# Patient Record
Sex: Female | Born: 1995 | Race: Black or African American | Hispanic: No | Marital: Single | Smoking: Never smoker
Health system: Southern US, Community
[De-identification: ages and names within clinical notes are randomized; demographics above are authoritative.]

## PROBLEM LIST (undated history)

## (undated) HISTORY — PX: WISDOM TOOTH EXTRACTION: SHX21

---

## 2018-12-16 ENCOUNTER — Other Ambulatory Visit: Payer: Self-pay

## 2018-12-16 DIAGNOSIS — Z20822 Contact with and (suspected) exposure to covid-19: Secondary | ICD-10-CM

## 2018-12-17 LAB — NOVEL CORONAVIRUS, NAA: SARS-CoV-2, NAA: NOT DETECTED

## 2019-12-10 ENCOUNTER — Emergency Department (HOSPITAL_BASED_OUTPATIENT_CLINIC_OR_DEPARTMENT_OTHER): Admission: EM | Admit: 2019-12-10 | Discharge: 2019-12-10 | Discharge status: Absent without leave | Payer: Self-pay

## 2020-01-29 ENCOUNTER — Inpatient Hospital Stay (HOSPITAL_COMMUNITY): Payer: Self-pay

## 2020-01-29 ENCOUNTER — Encounter (HOSPITAL_BASED_OUTPATIENT_CLINIC_OR_DEPARTMENT_OTHER): Payer: Self-pay | Admitting: *Deleted

## 2020-01-29 ENCOUNTER — Other Ambulatory Visit: Payer: Self-pay

## 2020-01-29 ENCOUNTER — Inpatient Hospital Stay (HOSPITAL_BASED_OUTPATIENT_CLINIC_OR_DEPARTMENT_OTHER)
Admission: AD | Admit: 2020-01-29 | Discharge: 2020-01-29 | Disposition: A | Discharge status: Home / Self Care | Payer: Self-pay | Attending: Obstetrics and Gynecology | Admitting: Obstetrics and Gynecology

## 2020-01-29 DIAGNOSIS — O209 Hemorrhage in early pregnancy, unspecified: Secondary | ICD-10-CM

## 2020-01-29 DIAGNOSIS — O99322 Drug use complicating pregnancy, second trimester: Secondary | ICD-10-CM | POA: Insufficient documentation

## 2020-01-29 DIAGNOSIS — O4690 Antepartum hemorrhage, unspecified, unspecified trimester: Secondary | ICD-10-CM

## 2020-01-29 DIAGNOSIS — O034 Incomplete spontaneous abortion without complication: Secondary | ICD-10-CM | POA: Insufficient documentation

## 2020-01-29 DIAGNOSIS — O26891 Other specified pregnancy related conditions, first trimester: Secondary | ICD-10-CM

## 2020-01-29 LAB — URINALYSIS, ROUTINE W REFLEX MICROSCOPIC
Glucose, UA: NEGATIVE mg/dL
Ketones, ur: 40 mg/dL — AB
Leukocytes,Ua: NEGATIVE
Nitrite: NEGATIVE
Protein, ur: 30 mg/dL — AB
Specific Gravity, Urine: >1.03 — ABNORMAL HIGH (ref 1.005–1.030)
pH: 6 (ref 5.0–8.0)

## 2020-01-29 LAB — URINALYSIS, MICROSCOPIC (REFLEX): RBC / HPF: >50 RBC/hpf (ref 0–5)

## 2020-01-29 LAB — COMPREHENSIVE METABOLIC PANEL
ALT: 9 U/L (ref 0–44)
AST: 13 U/L — ABNORMAL LOW (ref 15–41)
Albumin: 3.6 g/dL (ref 3.5–5.0)
Alkaline Phosphatase: 44 U/L (ref 38–126)
Anion gap: 11 (ref 5–15)
BUN: 7 mg/dL (ref 6–20)
CO2: 22 mmol/L (ref 22–32)
Calcium: 8.8 mg/dL — ABNORMAL LOW (ref 8.9–10.3)
Chloride: 101 mmol/L (ref 98–111)
Creatinine, Ser: 0.5 mg/dL (ref 0.44–1.00)
GFR calc Af Amer: >60 mL/min (ref >60)
GFR calc non Af Amer: >60 mL/min (ref >60)
Glucose, Bld: 84 mg/dL (ref 70–99)
Potassium: 3.2 mmol/L — ABNORMAL LOW (ref 3.5–5.1)
Sodium: 134 mmol/L — ABNORMAL LOW (ref 135–145)
Total Bilirubin: 0.4 mg/dL (ref 0.3–1.2)
Total Protein: 7.5 g/dL (ref 6.5–8.1)

## 2020-01-29 LAB — CBC WITH DIFFERENTIAL/PLATELET
Abs Immature Granulocytes: 0.04 10*3/uL (ref 0.00–0.07)
Basophils Absolute: 0 10*3/uL (ref 0.0–0.1)
Basophils Relative: 0 %
Eosinophils Absolute: 0.1 10*3/uL (ref 0.0–0.5)
Eosinophils Relative: 1 %
HCT: 25.9 % — ABNORMAL LOW (ref 36.0–46.0)
Hemoglobin: 7.6 g/dL — ABNORMAL LOW (ref 12.0–15.0)
Immature Granulocytes: 0 %
Lymphocytes Relative: 17 %
Lymphs Abs: 1.7 10*3/uL (ref 0.7–4.0)
MCH: 22.4 pg — ABNORMAL LOW (ref 26.0–34.0)
MCHC: 29.3 g/dL — ABNORMAL LOW (ref 30.0–36.0)
MCV: 76.4 fL — ABNORMAL LOW (ref 80.0–100.0)
Monocytes Absolute: 0.5 10*3/uL (ref 0.1–1.0)
Monocytes Relative: 5 %
Neutro Abs: 7.4 10*3/uL (ref 1.7–7.7)
Neutrophils Relative %: 77 %
Platelets: 438 10*3/uL — ABNORMAL HIGH (ref 150–400)
RBC: 3.39 MIL/uL — ABNORMAL LOW (ref 3.87–5.11)
RDW: 19.7 % — ABNORMAL HIGH (ref 11.5–15.5)
WBC: 9.7 10*3/uL (ref 4.0–10.5)
nRBC: 0 % (ref 0.0–0.2)

## 2020-01-29 LAB — HCG, QUANTITATIVE, PREGNANCY: hCG, Beta Chain, Quant, S: 184 m[IU]/mL — ABNORMAL HIGH (ref <5)

## 2020-01-29 LAB — ABO/RH: ABO/RH(D): O POS

## 2020-01-29 LAB — PREGNANCY, URINE: Preg Test, Ur: POSITIVE — AB

## 2020-01-29 MED ORDER — DOXYCYCLINE HYCLATE 100 MG PO CAPS: 100.0000 mg | ORAL_CAPSULE | Freq: Two times a day (BID) | ORAL | 0 refills | Status: AC

## 2020-01-29 MED ORDER — MISOPROSTOL 200 MCG PO TABS: 800.0000 ug | ORAL_TABLET | Freq: Once | ORAL | 0 refills | Status: AC

## 2020-01-29 NOTE — ED Triage Notes (Signed)
Right lower quad pain since last night. LMP started 2 days ago.

## 2020-01-29 NOTE — ED Provider Notes (Signed)
[facility] EMERGENCY DEPARTMENT Provider Note   CSN: [id number] Arrival date & time: 01/29/20  1209     History Chief Complaint  Patient presents with  . Abdominal Pain    Alexandra Perkins is a 24 y.o. female.  Alexandra Perkins is a 24 y.o. female who is otherwise healthy, presents to the ED for vaginal bleeding and intermittent right lower quadrant pain.  Patient states that she had an abortion on September 17 which was done with medication, she did not have any procedure performed after that she had vaginal bleeding and some right-sided abdominal pain for about 2 days, and then states her symptoms resolved.  2 weeks passed and then she anticipated getting her typical menstrual cycle, started having bleeding yesterday but right lower quadrant pain returned and has been persistent since last night, keeping her from sleeping.  She started having vaginal bleeding yesterday which she reports is moderate and typical for her menstrual cycle, she has to change her pad every 4 hours.  She has not taken another pregnancy test since her abortion, and reports she has not been sexually active since her abortion.  She denies any associated lightheadedness, no vaginal discharge or dysuria.  She has a known history of low hemoglobin and iron deficiency anemia.        History reviewed. No pertinent past medical history.  There are no problems to display for this patient.   History reviewed. No pertinent surgical history.   OB History   No obstetric history on file.     No family history on file.  Social History   Tobacco Use  . Smoking status: Never Smoker  . Smokeless tobacco: Never Used  Substance Use Topics  . Alcohol use: Yes  . Drug use: Yes    Types: Marijuana    Home Medications Prior to Admission medications   Medication Sig Start Date End Date Taking? Authorizing Provider  Iron-FA-B Cmp-C-Biot-Probiotic (FUSION PLUS) CAPS Take 1 capsule by mouth daily. 08/11/16   Yes [provider]    Allergies    Patient has no known allergies.  Review of Systems   Review of Systems  Constitutional: Negative for chills and fever.  Respiratory: Negative for cough and shortness of breath.   Cardiovascular: Negative for chest pain.  Gastrointestinal: Positive for abdominal pain. Negative for nausea and vomiting.  Genitourinary: Positive for pelvic pain and vaginal bleeding. Negative for dysuria and vaginal discharge.  Neurological: Negative for syncope and light-headedness.  All other systems reviewed and are negative.   Physical Exam Updated Vital Signs BP 117/72   Pulse 73   Temp 98.5 F (36.9 C) (Oral)   Resp 20   Ht 5\' 5"  (1.651 m)   Wt 66.2 kg   LMP 01/27/2020   SpO2 100%   BMI 24.30 kg/m   Physical Exam Vitals and nursing note reviewed.  Constitutional:      General: She is not in acute distress.    Appearance: She is well-developed. She is not ill-appearing or diaphoretic.     Comments: Well-appearing and in no distress  HENT:     Head: Normocephalic and atraumatic.  Eyes:     General:        Right eye: No discharge.        Left eye: No discharge.     Pupils: Pupils are equal, round, and reactive to light.  Cardiovascular:     Rate and Rhythm: Normal rate and regular rhythm.     Heart  sounds: Normal heart sounds.  Pulmonary:     Effort: Pulmonary effort is normal. No respiratory distress.     Breath sounds: Normal breath sounds. No wheezing or rales.     Comments: Respirations equal and unlabored, patient able to speak in full sentences, lungs clear to auscultation bilaterally Abdominal:     General: Bowel sounds are normal. There is no distension.     Palpations: Abdomen is soft. There is no mass.     Tenderness: There is abdominal tenderness in the right lower quadrant. There is no guarding.     Comments: Abdomen is soft, nondistended, bowel sounds present throughout, there is some tenderness in the right lower quadrant  without guarding or rebound tenderness, all other quadrants nontender to palpation.  Genitourinary:    Comments: Defer pelvic exam to OB/GYN Musculoskeletal:        General: No deformity.     Cervical back: Neck supple.  Skin:    General: Skin is warm and dry.     Capillary Refill: Capillary refill takes less than 2 seconds.  Neurological:     Mental Status: She is alert.     Coordination: Coordination normal.     Comments: Speech is clear, able to follow commands Moves extremities without ataxia, coordination intact  Psychiatric:        Mood and Affect: Mood normal.        Behavior: Behavior normal.     ED Results / Procedures / Treatments   Labs (all labs ordered are listed, but only abnormal results are displayed) Labs Reviewed  URINALYSIS, ROUTINE W REFLEX MICROSCOPIC - Abnormal; Notable for the following components:      Result Value   Specific Gravity, Urine >1.030 (*)    Hgb urine dipstick LARGE (*)    Bilirubin Urine SMALL (*)    Ketones, ur 40 (*)    Protein, ur 30 (*)    All other components within normal limits  CBC WITH DIFFERENTIAL/PLATELET - Abnormal; Notable for the following components:   RBC 3.39 (*)    Hemoglobin 7.6 (*)    HCT 25.9 (*)    MCV 76.4 (*)    MCH 22.4 (*)    MCHC 29.3 (*)    RDW 19.7 (*)    Platelets 438 (*)    All other components within normal limits  COMPREHENSIVE METABOLIC PANEL - Abnormal; Notable for the following components:   Sodium 134 (*)    Potassium 3.2 (*)    Calcium 8.8 (*)    AST 13 (*)    All other components within normal limits  PREGNANCY, URINE - Abnormal; Notable for the following components:   Preg Test, Ur POSITIVE (*)    All other components within normal limits  URINALYSIS, MICROSCOPIC (REFLEX) - Abnormal; Notable for the following components:   Bacteria, UA MANY (*)    Trichomonas, UA PRESENT (*)    All other components within normal limits  HCG, QUANTITATIVE, PREGNANCY  ABO/RH     EKG None  Radiology No results found.  Procedures Procedures (including critical care time)  Medications Ordered in ED Medications - No data to display  ED Course  I have reviewed the triage vital signs and the nursing notes.  Pertinent labs & imaging results that were available during my care of the patient were reviewed by me and considered in my medical decision making (see chart for details).    MDM Rules/Calculators/A&P  24 year old female presents to the emergency department for evaluation of vaginal bleeding and right lower quadrant abdominal pain.  She had an abortion on 9/17 done with medication, did not have any procedure, had 2 days of bleeding and pain after this and then symptoms resolved and she thought she was starting back her normal menstrual cycle, her pregnancy test here today is positive and she had return of right lower quadrant pain last night that has been severe and constant.  She has not had any fevers or vaginal discharge.  States bleeding is like her typical menstrual cycle.  Patient found to have a hemoglobin of 7.6 but she has a history of low hemoglobin, previously documented at 8.1.  Is not having any lightheadedness or syncope.  Potassium of 3.2 but no other significant electrolyte derangements, normal renal and liver function.  Urinalysis with many bacteria present, also some squamous cells, trichomonas present as well.  Feel patient will need ultrasound to rule out ectopic pregnancy versus incomplete abortion or retained products.  Ultrasound is not available at this facility tonight.  Case discussed with Dr. Nettie Elm with OB/GYN, we do not have ultrasound available at [facility], he agrees that the patient will need to be evaluated in the MAU for ultrasound. She is currently hemodynamically stable. Will get quantitative hCG and Rh while patient is waiting for transport.  Initially plan for [facility]  transport patient is hemodynamically stable, without any near syncopal symptoms, and does not have any way to get back to her car if she was transported by [facility], myself and Dr. Troponin feel patient is stable for transport via private vehicle to MAU.  I have given patient strict instructions not to make any stops on the way and proceed directly to [facility] to the MAU.  She expresses understanding and agreement.  Nursing staff has called MAU and given report prior to patient's transfer.  Final Clinical Impression(s) / ED Diagnoses Final diagnoses:  Vaginal bleeding in pregnancy    Rx / DC Orders ED Discharge Orders    None       Dartha Lodge, [place] 01/29/20 1947    Terald Sleeper, MD 01/30/20 [phone]

## 2020-01-29 NOTE — ED Notes (Signed)
Pt complaining of vaginal bleeding with RLQ intermittent pain starting last night. States she had an abortion September 17th with a pill. States had similar pain after abortion however it went away. Pt denies intercourse after abortion. States bleeding is like her typical menstrual cycle, not heavy/light. Hx iron deficiency and low Hgb.  Pt informed of by this RN and PA, Adelina Mings, of importance of ultrasound and that there is no [place] tech available tonight. Agreeable to transfer to MAU at [facility], however worried about getting back to car at [facility] as she does not have someone to drive her back if discharged.

## 2020-01-29 NOTE — ED Notes (Signed)
Pt d/c POV to MAU unit. Report called to MAU prior to pt leaving. Denies questions. Ambulatory.

## 2020-01-29 NOTE — MAU Note (Signed)
. .  Alexandra Perkins is a 24 y.o.  here in MAU reporting: Transfer from [facility], recent abortion on 01/12/20. She states that she took the "pills" on 01/13/20. Had vaginal bleeding for a week and states she started her "other cycle" Saturday. Lower right sided abdominal pain that started last night and is exacerbated with movement.   Pain score: 0 Vitals:   01/29/20 1926 01/29/20 2026  BP: 124/63 129/78  Pulse: 72 77  Resp:  16  Temp:  99 F (37.2 C)  SpO2: 100%

## 2020-01-29 NOTE — Discharge Instructions (Addendum)
Return to care   If you have heavier bleeding that soaks through more that 2 pads per hour for an hour or more  If you bleed so much that you feel like you might pass out or you do pass out  If you have significant abdominal pain that is not improved with Tylenol   Or if you have a fever >100.4

## 2020-01-29 NOTE — MAU Provider Note (Signed)
History     CSN: [id number]  Arrival date and time: 01/29/20 1209   First Provider Initiated Contact with Patient 01/29/20 2044      Chief Complaint  Patient presents with  . Abdominal Pain   Alexandra Perkins is a 24 y.o. G1P0010 at Unknown who presents for vaginal bleeding. She was transferred from [facility] for further evaluation. Patient had medical abortion through a [facility] on 9/17. Reports some bleeding that resolved then started again this weekend. Last night had significant lower abdominal cramping that has since improved without medication. Reports current bleeding is like a period; is not saturating pads or passing blood clots. Denies fever/chills. Does not have a PCP or ob/gyn.   Location: abdomen Quality: cramping Severity: currently 0/10 on pain scale Duration: 1 day Timing: intermittent Modifying factors: none Associated signs and symptoms: vaginal bleeding    OB History    Gravida  1   Para      Term      Preterm      AB  1   Living        SAB      TAB      Ectopic      Multiple      Live Births              History reviewed. No pertinent past medical history.  Past Surgical History:  Procedure Laterality Date  . WISDOM TOOTH EXTRACTION      No family history on file.  Social History   Tobacco Use  . Smoking status: Never Smoker  . Smokeless tobacco: Never Used  Substance Use Topics  . Alcohol use: Yes  . Drug use: Yes    Types: Marijuana    Allergies: No Known Allergies  Medications Prior to Admission  Medication Sig Dispense Refill Last Dose  . Iron-FA-B Cmp-C-Biot-Probiotic (FUSION PLUS) CAPS Take 1 capsule by mouth daily.   Past Week at Unknown time    Review of Systems  Constitutional: Negative.   Gastrointestinal: Positive for abdominal pain (not currently). Negative for constipation, diarrhea, nausea and vomiting.  Genitourinary: Positive for vaginal bleeding. Negative for vaginal discharge.   Neurological: Negative.    Physical Exam   Blood pressure 129/78, pulse 77, temperature 99 F (37.2 C), temperature source Oral, resp. rate 16, height 5\' 5"  (1.651 m), weight 65 kg, last menstrual period 01/27/2020, SpO2 100 %.  Physical Exam Vitals and nursing note reviewed.  Constitutional:      General: She is not in acute distress.    Appearance: She is well-developed and normal weight.  HENT:     Head: Normocephalic and atraumatic.  Cardiovascular:     Rate and Rhythm: Normal rate.     Heart sounds: Normal heart sounds.  Pulmonary:     Effort: Pulmonary effort is normal. No respiratory distress.     Breath sounds: Normal breath sounds.  Abdominal:     General: Abdomen is flat. There is no distension.     Palpations: Abdomen is soft.     Tenderness: There is no abdominal tenderness.  Neurological:     Mental Status: She is alert.     MAU Course  Procedures Results for orders placed or performed during the hospital encounter of 01/29/20 (from the past 24 hour(s))  CBC with Differential     Status: Abnormal   Collection Time: 01/29/20 12:37 PM  Result Value Ref Range   WBC 9.7 4.0 - 10.5 K/uL  RBC 3.39 (L) 3.87 - 5.11 MIL/uL   Hemoglobin 7.6 (L) 12.0 - 15.0 g/dL   HCT 67.1 (L) 36 - 46 %   MCV 76.4 (L) 80.0 - 100.0 fL   MCH 22.4 (L) 26.0 - 34.0 pg   MCHC 29.3 (L) 30.0 - 36.0 g/dL   RDW 24.5 (H) 80.9 - 98.3 %   Platelets 438 (H) 150 - 400 K/uL   nRBC 0.0 0.0 - 0.2 %   Neutrophils Relative % 77 %   Neutro Abs 7.4 1.7 - 7.7 K/uL   Lymphocytes Relative 17 %   Lymphs Abs 1.7 0.7 - 4.0 K/uL   Monocytes Relative 5 %   Monocytes Absolute 0.5 0 - 1 K/uL   Eosinophils Relative 1 %   Eosinophils Absolute 0.1 0 - 0 K/uL   Basophils Relative 0 %   Basophils Absolute 0.0 0 - 0 K/uL   Immature Granulocytes 0 %   Abs Immature Granulocytes 0.04 0.00 - 0.07 K/uL  Comprehensive metabolic panel     Status: Abnormal   Collection Time: 01/29/20 12:37 PM  Result Value Ref  Range   Sodium 134 (L) 135 - 145 mmol/L   Potassium 3.2 (L) 3.5 - 5.1 mmol/L   Chloride 101 98 - 111 mmol/L   CO2 22 22 - 32 mmol/L   Glucose, Bld 84 70 - 99 mg/dL   BUN 7 6 - 20 mg/dL   Creatinine, Ser 3.82 0.44 - 1.00 mg/dL   Calcium 8.8 (L) 8.9 - 10.3 mg/dL   Total Protein 7.5 6.5 - 8.1 g/dL   Albumin 3.6 3.5 - 5.0 g/dL   AST 13 (L) 15 - 41 U/L   ALT 9 0 - 44 U/L   Alkaline Phosphatase 44 38 - 126 U/L   Total Bilirubin 0.4 0.3 - 1.2 mg/dL   GFR calc non Af Amer >60 >60 mL/min   GFR calc Af Amer >60 >60 mL/min   Anion gap 11 5 - 15  Urinalysis, Routine w reflex microscopic Urine, Clean Catch     Status: Abnormal   Collection Time: 01/29/20  3:15 PM  Result Value Ref Range   Color, Urine YELLOW YELLOW   APPearance CLEAR CLEAR   Specific Gravity, Urine >1.030 (H) 1.005 - 1.030   pH 6.0 5.0 - 8.0   Glucose, UA NEGATIVE NEGATIVE mg/dL   Hgb urine dipstick LARGE (A) NEGATIVE   Bilirubin Urine SMALL (A) NEGATIVE   Ketones, ur 40 (A) NEGATIVE mg/dL   Protein, ur 30 (A) NEGATIVE mg/dL   Nitrite NEGATIVE NEGATIVE   Leukocytes,Ua NEGATIVE NEGATIVE  Pregnancy, urine     Status: Abnormal   Collection Time: 01/29/20  3:15 PM  Result Value Ref Range   Preg Test, Ur POSITIVE (A) NEGATIVE  Urinalysis, Microscopic (reflex)     Status: Abnormal   Collection Time: 01/29/20  3:15 PM  Result Value Ref Range   RBC / HPF >50 0 - 5 RBC/hpf   WBC, UA 0-5 0 - 5 WBC/hpf   Bacteria, UA MANY (A) NONE SEEN   Squamous Epithelial / LPF 6-10 0 - 5   Mucus PRESENT    Trichomonas, UA PRESENT (A) NONE SEEN  hCG, quantitative, pregnancy     Status: Abnormal   Collection Time: 01/29/20  6:22 PM  Result Value Ref Range   hCG, Beta Chain, Quant, S 184 (H) <5 mIU/mL  ABO/Rh     Status: None   Collection Time: 01/29/20  6:22 PM  Result  Value Ref Range   ABO/RH(D) O POS    No rh immune globuloin      NOT A RH IMMUNE GLOBULIN CANDIDATE, PT RH POSITIVE Performed at [facility], [address], [place], [place] [address]    US OB LESS THAN 14 WEEKS WITH [place] TRANSVAGINAL  Result Date: 01/29/2020 CLINICAL DATA:  Right lower quadrant pain. Therapeutic abortion 01/12/2020. Beta HCG 184. EXAM: OBSTETRIC <14 WK [place] AND TRANSVAGINAL OB US TECHNIQUE: Both transabdominal and transvaginal ultrasound examinations were performed for complete evaluation of the gestation as well as the maternal uterus, adnexal regions, and pelvic cul-de-sac. Transvaginal technique was performed to assess early pregnancy. COMPARISON:  None. FINDINGS: Intrauterine gestational sac: None Yolk sac:  Not Visualized. Embryo:  Not Visualized. Maternal uterus/adnexae: The uterus is anteverted. The endometrium is heterogeneous measuring 10 mm. There is endometrial vascularity. No evidence of intrauterine gestational sac. The right ovary is normal measuring 3.1 x 2.3 x 2.5 cm with blood flow. The left ovary is normal measuring 4.0 x 2.6 x 3.6 cm with blood flow. There is no adnexal mass. No pelvic free fluid. IMPRESSION: 1. No intrauterine gestational sac. Heterogeneous endometrium measuring 10 mm with internal vascularity. Sonographic findings are compatible with retained products of conception in the correct clinical setting. 2. Normal sonographic appearance of both ovaries.  No adnexal mass. Electronically Signed   By: Narda Rutherford M.D.   On: 01/29/2020 21:23   MDM RH positive - rhogam not indicated  Labs from [facility] reviewed - hemoglobin low at 7.6 with nothing in epic to compare to. Patient reports known history of anemia. Vital signs are stable & patient has no symptoms of acute blood loss.   HCG tonight is 184. Ultrasound shows ET of 10 mm & some vascularity. Reviewed patient with Dr. Alysia Penna. Can offer cytotec vs outpatient D&C. Reviewed hemoglobin with Dr. Alysia Penna, ok for cytotec if patient chooses as she is stable & don't anticipate acute blood loss based on ultrasound. Will also start on abx due to increased temp. White count  normal tonight.   Reviewed results & treatment options with patient. She prefers cytotec to take at home. She lives with her parents who will be with her during treatment. Reviewed bleeding precautions & when to present to closest ED. She would like follow up with our office as she does not have f/u with [facility].   Assessment and Plan   1. Retained products of conception following abortion  rx cytotec. Reviewed bleeding precautions & when to return to MAU. Continue iron supplements she was already taking -will msg office to get f/u -rx doxycycline -pelvic rest  2. Vaginal bleeding in pregnancy   3. Vaginal bleeding in pregnancy, first trimester   4. Abdominal pain during pregnancy in first trimester      Judeth Horn 01/29/2020, 8:44 PM

## 2020-02-13 ENCOUNTER — Ambulatory Visit: Payer: Self-pay | Admitting: Obstetrics and Gynecology

## 2021-06-22 IMAGING — US US OB < 14 WEEKS - US OB TV
1 series · 15 of 28 positions shown · non-contrast
Comparison: None.

CLINICAL DATA: Right lower quadrant pain. Therapeutic abortion
01/12/2020. Beta HCG 184.

EXAM:
OBSTETRIC <14 WK US AND TRANSVAGINAL OB US
TECHNIQUE: Both transabdominal and transvaginal ultrasound examinations were
performed for complete evaluation of the gestation as well as the
maternal uterus, adnexal regions, and pelvic cul-de-sac.
Transvaginal technique was performed to assess early pregnancy.

[Series 1: us ob < 14 weeks - us ob tv · 15 of 38 slices shown]
[im 1/38]
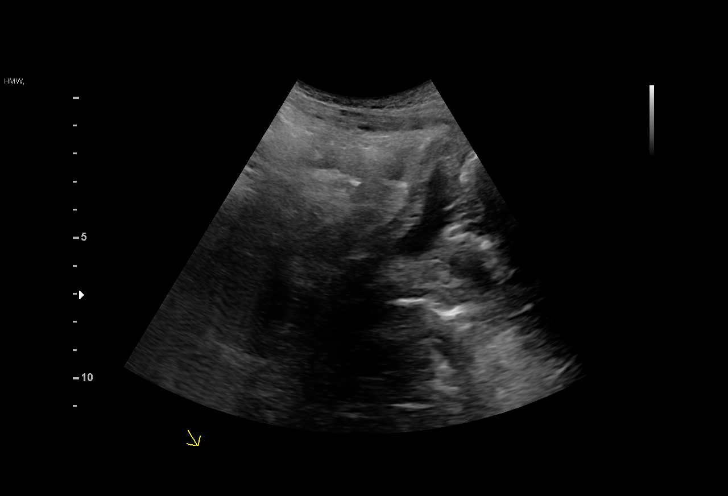
[im 3/38]
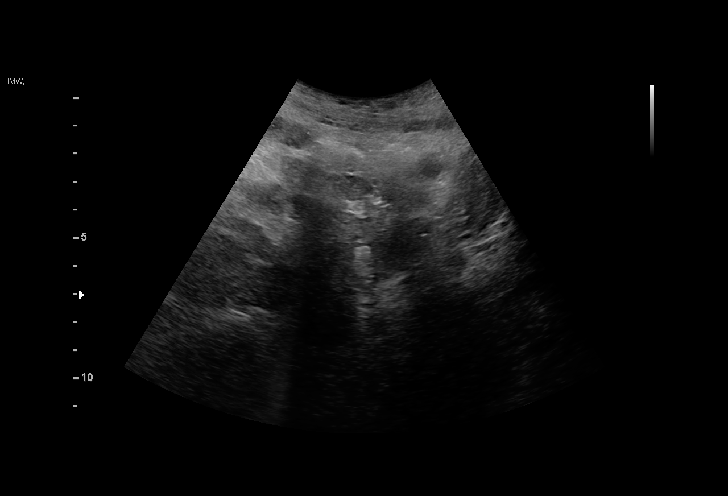
[im 6/38]
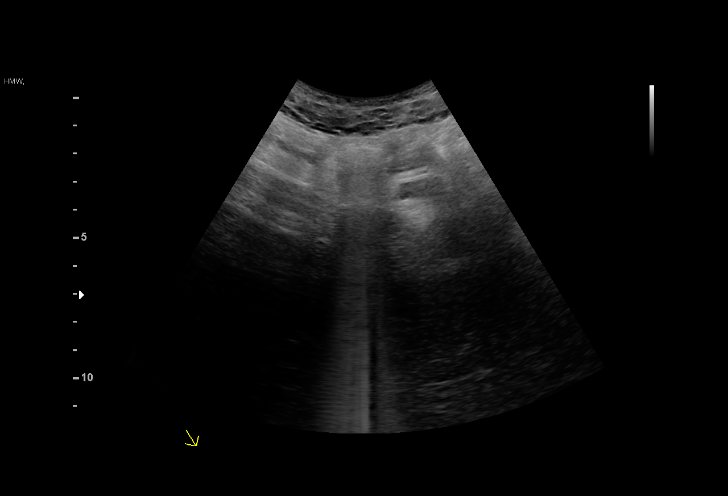
[im 9/38]
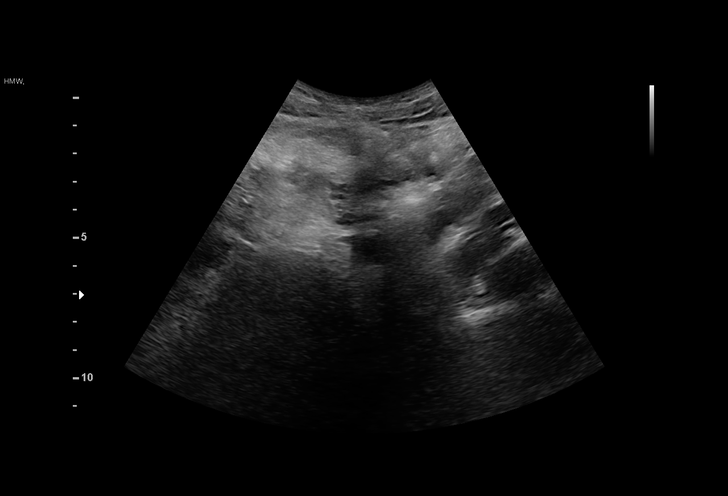
[im 11/38]
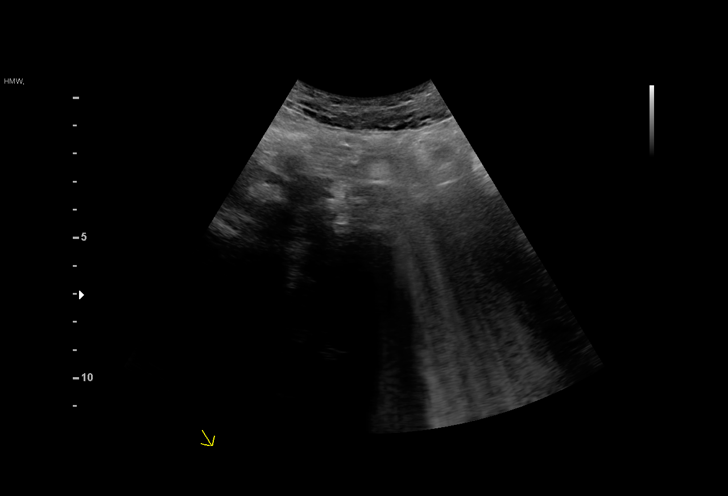
[im 14/38]
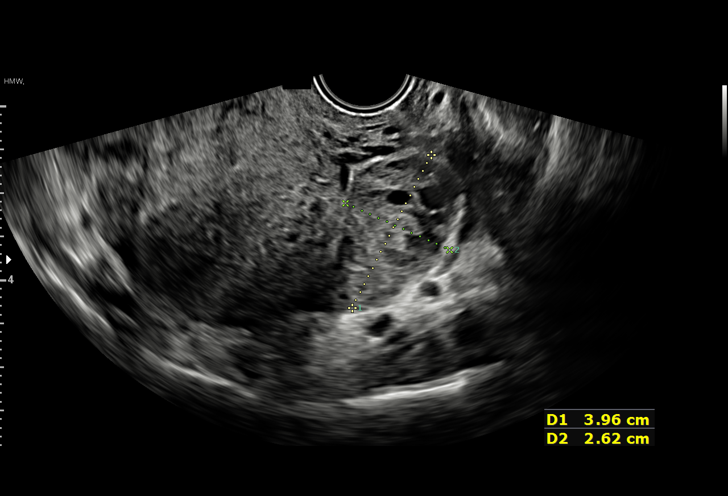
[im 17/38]
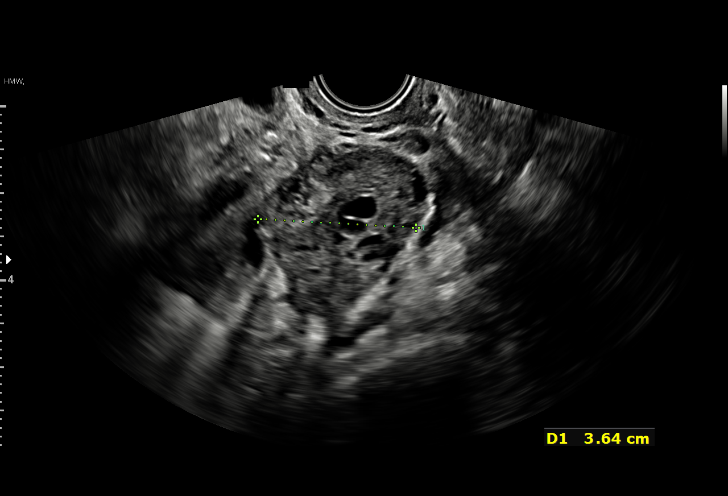
[im 20/38]
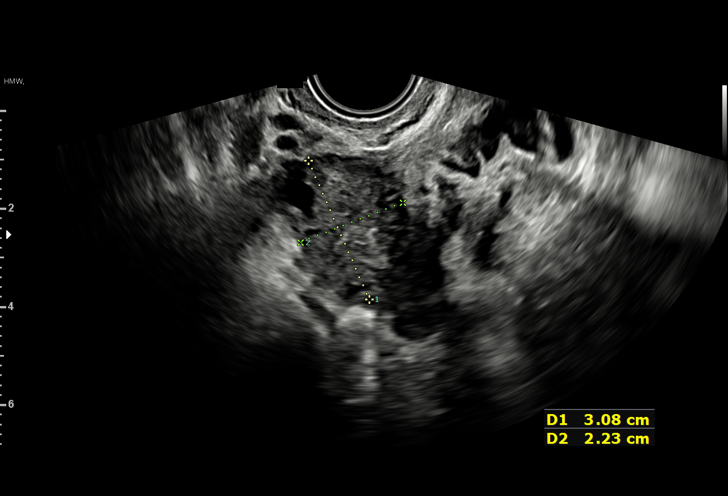
[im 21/38]
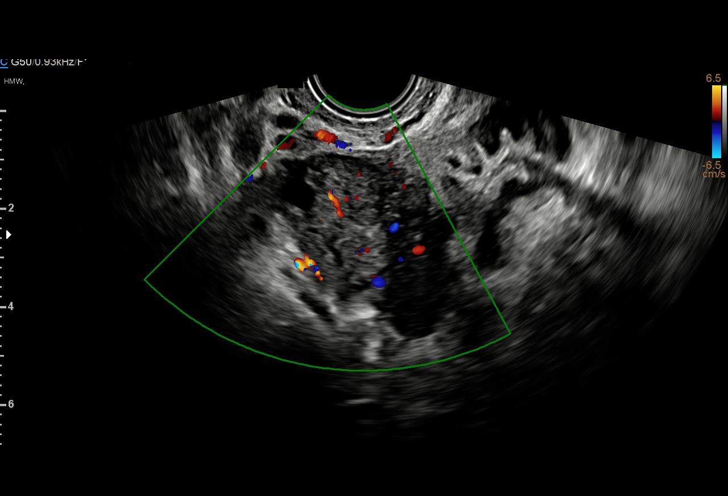
[im 24/38]
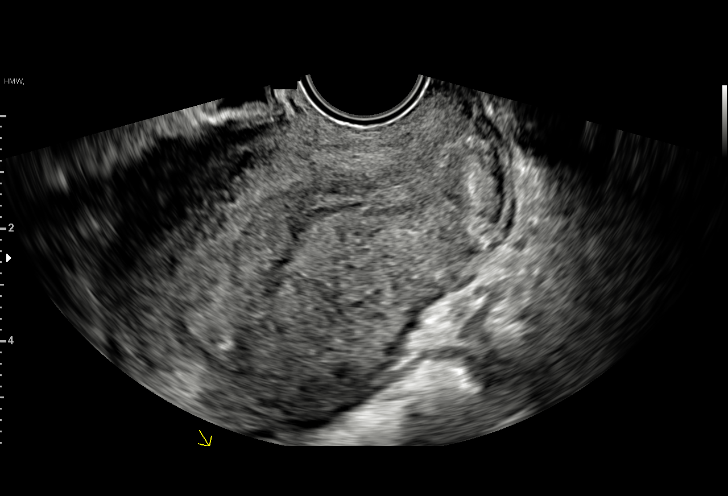
[im 27/38]
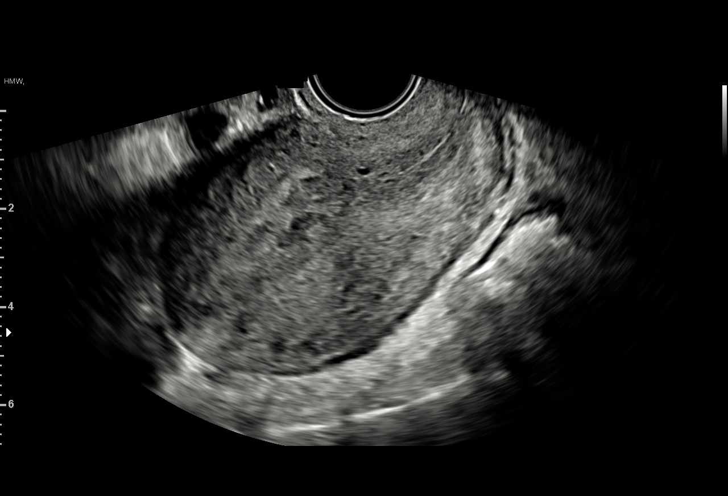
[im 29/38]
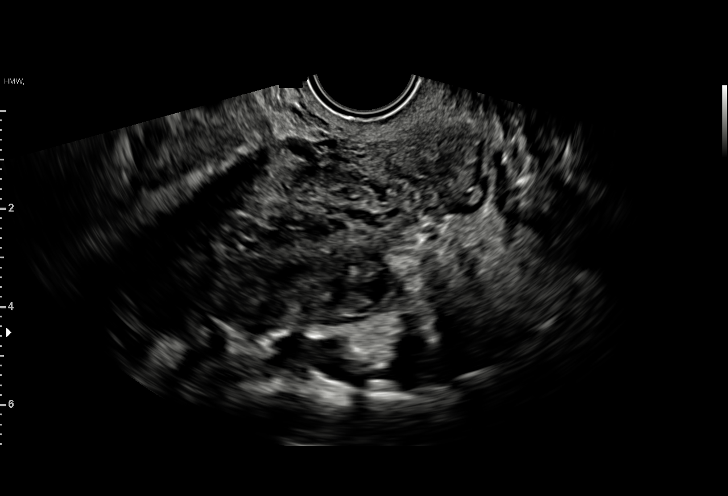
[im 32/38]
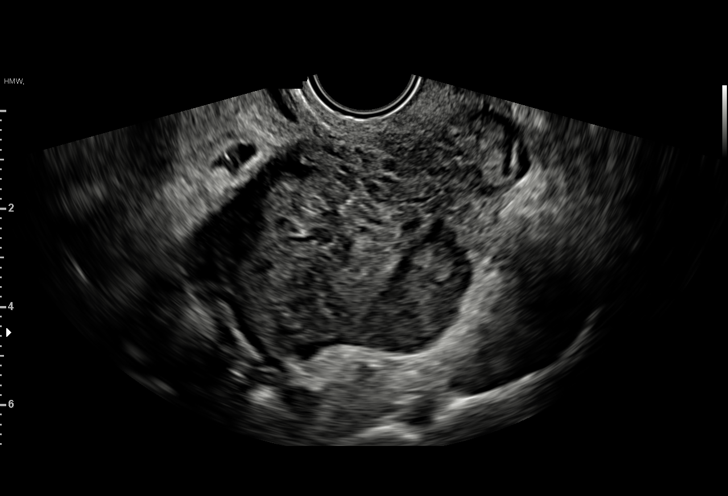
[im 35/38]
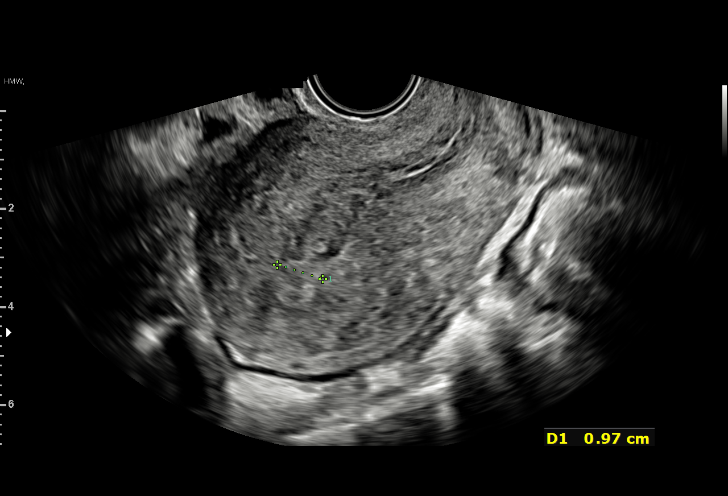
[im 38/38]
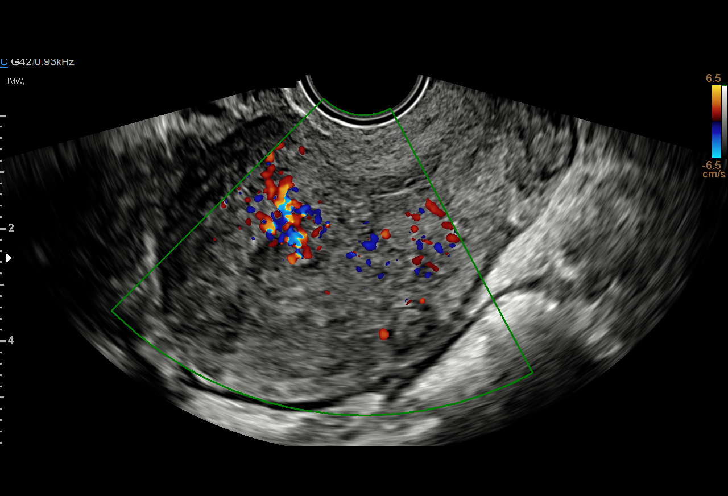

[15 of 28 positions shown; findings below may reference images not displayed]

FINDINGS: Intrauterine gestational sac: None

Yolk sac:  Not Visualized.

Embryo:  Not Visualized.

Maternal uterus/adnexae: The uterus is anteverted. The endometrium
is heterogeneous measuring 10 mm. There is endometrial vascularity.
No evidence of intrauterine gestational sac. The right ovary is
normal measuring 3.1 x 2.3 x 2.5 cm with blood flow. The left ovary
is normal measuring 4.0 x 2.6 x 3.6 cm with blood flow. There is no
adnexal mass. No pelvic free fluid.
IMPRESSION: 1. No intrauterine gestational sac. Heterogeneous endometrium
measuring 10 mm with internal vascularity. Sonographic findings are
compatible with retained products of conception in the correct
clinical setting.
2. Normal sonographic appearance of both ovaries.  No adnexal mass.
